# Patient Record
Sex: Male | Born: 2000 | Race: White | Hispanic: No | Marital: Single
Health system: Southern US, Community
[De-identification: ages and names within clinical notes are randomized; demographics above are authoritative.]

---
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
## 2021-03-25 DIAGNOSIS — Z0000 Encounter for general adult medical examination without abnormal findings: Secondary | ICD-10-CM | POA: Diagnosis not present

## 2021-03-25 DIAGNOSIS — Z13220 Encounter for screening for lipoid disorders: Secondary | ICD-10-CM | POA: Diagnosis not present
